# Patient Record
Sex: Female | Born: 1960 | Marital: Married
Health system: Southern US, Community
[De-identification: ages and names within clinical notes are randomized; demographics above are authoritative.]

---

## 2017-07-03 ENCOUNTER — Other Ambulatory Visit (HOSPITAL_COMMUNITY): Payer: Self-pay

## 2017-07-03 ENCOUNTER — Inpatient Hospital Stay
Admission: AD | Admit: 2017-07-03 | Discharge: 2017-07-23 | Disposition: A | Discharge status: Skilled Nursing Facility | Payer: Self-pay | Source: Ambulatory Visit | Attending: Internal Medicine | Admitting: Internal Medicine

## 2017-07-03 DIAGNOSIS — R52 Pain, unspecified: Secondary | ICD-10-CM

## 2017-07-03 DIAGNOSIS — Z0189 Encounter for other specified special examinations: Secondary | ICD-10-CM

## 2017-07-03 DIAGNOSIS — J9690 Respiratory failure, unspecified, unspecified whether with hypoxia or hypercapnia: Secondary | ICD-10-CM

## 2017-07-04 ENCOUNTER — Other Ambulatory Visit (HOSPITAL_COMMUNITY): Payer: Self-pay

## 2017-07-04 LAB — CBC WITH DIFFERENTIAL/PLATELET
Basophils Absolute: 0 10*3/uL (ref 0.0–0.1)
Basophils Relative: 1 %
EOS ABS: 0.1 10*3/uL (ref 0.0–0.7)
EOS PCT: 1 %
HCT: 27.4 % — ABNORMAL LOW (ref 36.0–46.0)
Hemoglobin: 8.3 g/dL — ABNORMAL LOW (ref 12.0–15.0)
LYMPHS ABS: 1.9 10*3/uL (ref 0.7–4.0)
Lymphocytes Relative: 25 %
MCH: 28 pg (ref 26.0–34.0)
MCHC: 30.3 g/dL (ref 30.0–36.0)
MCV: 92.6 fL (ref 78.0–100.0)
MONOS PCT: 13 %
Monocytes Absolute: 1 10*3/uL (ref 0.1–1.0)
Neutro Abs: 4.8 10*3/uL (ref 1.7–7.7)
Neutrophils Relative %: 60 %
PLATELETS: 660 10*3/uL — AB (ref 150–400)
RBC: 2.96 MIL/uL — ABNORMAL LOW (ref 3.87–5.11)
RDW: 15.8 % — AB (ref 11.5–15.5)
WBC: 7.9 10*3/uL (ref 4.0–10.5)

## 2017-07-04 LAB — COMPREHENSIVE METABOLIC PANEL
ALK PHOS: 94 U/L (ref 38–126)
ALT: 33 U/L (ref 14–54)
AST: 35 U/L (ref 15–41)
Albumin: 2.3 g/dL — ABNORMAL LOW (ref 3.5–5.0)
Anion gap: 16 — ABNORMAL HIGH (ref 5–15)
BUN: 32 mg/dL — AB (ref 6–20)
CHLORIDE: 102 mmol/L (ref 101–111)
CO2: 22 mmol/L (ref 22–32)
CREATININE: 1.01 mg/dL — AB (ref 0.44–1.00)
Calcium: 9.1 mg/dL (ref 8.9–10.3)
GFR calc Af Amer: >60 mL/min (ref >60)
Glucose, Bld: 184 mg/dL — ABNORMAL HIGH (ref 65–99)
Potassium: 4 mmol/L (ref 3.5–5.1)
SODIUM: 140 mmol/L (ref 135–145)
Total Bilirubin: 0.4 mg/dL (ref 0.3–1.2)
Total Protein: 7.1 g/dL (ref 6.5–8.1)

## 2017-07-04 LAB — PROTIME-INR
INR: 1.2
PROTHROMBIN TIME: 15.1 s (ref 11.4–15.2)

## 2017-07-04 LAB — HEMOGLOBIN A1C
Hgb A1c MFr Bld: 7.4 % — ABNORMAL HIGH (ref 4.8–5.6)
Mean Plasma Glucose: 165.68 mg/dL

## 2017-07-05 LAB — URINALYSIS, ROUTINE W REFLEX MICROSCOPIC
BILIRUBIN URINE: NEGATIVE
Glucose, UA: NEGATIVE mg/dL
Hgb urine dipstick: NEGATIVE
Ketones, ur: NEGATIVE mg/dL
Nitrite: NEGATIVE
PROTEIN: 100 mg/dL — AB
SPECIFIC GRAVITY, URINE: 1.016 (ref 1.005–1.030)
pH: 6 (ref 5.0–8.0)

## 2017-07-05 LAB — C DIFFICILE QUICK SCREEN W PCR REFLEX
C DIFFICILE (CDIFF) INTERP: NOT DETECTED
C DIFFICILE (CDIFF) TOXIN: NEGATIVE
C Diff antigen: NEGATIVE

## 2017-07-06 LAB — URINE CULTURE: CULTURE: NO GROWTH

## 2017-07-07 LAB — BASIC METABOLIC PANEL
ANION GAP: 14 (ref 5–15)
BUN: 25 mg/dL — ABNORMAL HIGH (ref 6–20)
CALCIUM: 9.6 mg/dL (ref 8.9–10.3)
CO2: 25 mmol/L (ref 22–32)
Chloride: 99 mmol/L — ABNORMAL LOW (ref 101–111)
Creatinine, Ser: 0.81 mg/dL (ref 0.44–1.00)
GLUCOSE: 198 mg/dL — AB (ref 65–99)
POTASSIUM: 4 mmol/L (ref 3.5–5.1)
Sodium: 138 mmol/L (ref 135–145)

## 2017-07-07 LAB — CBC
HEMATOCRIT: 31.2 % — AB (ref 36.0–46.0)
HEMOGLOBIN: 9.5 g/dL — AB (ref 12.0–15.0)
MCH: 27.6 pg (ref 26.0–34.0)
MCHC: 30.4 g/dL (ref 30.0–36.0)
MCV: 90.7 fL (ref 78.0–100.0)
Platelets: 701 10*3/uL — ABNORMAL HIGH (ref 150–400)
RBC: 3.44 MIL/uL — AB (ref 3.87–5.11)
RDW: 15.3 % (ref 11.5–15.5)
WBC: 8.6 10*3/uL (ref 4.0–10.5)

## 2017-07-09 LAB — PHOSPHORUS: Phosphorus: 4.5 mg/dL (ref 2.5–4.6)

## 2017-07-09 LAB — CBC
HEMATOCRIT: 30.5 % — AB (ref 36.0–46.0)
Hemoglobin: 9.3 g/dL — ABNORMAL LOW (ref 12.0–15.0)
MCH: 27.6 pg (ref 26.0–34.0)
MCHC: 30.5 g/dL (ref 30.0–36.0)
MCV: 90.5 fL (ref 78.0–100.0)
Platelets: 589 10*3/uL — ABNORMAL HIGH (ref 150–400)
RBC: 3.37 MIL/uL — ABNORMAL LOW (ref 3.87–5.11)
RDW: 15.6 % — AB (ref 11.5–15.5)
WBC: 7.8 10*3/uL (ref 4.0–10.5)

## 2017-07-09 LAB — BASIC METABOLIC PANEL
ANION GAP: 14 (ref 5–15)
BUN: 30 mg/dL — ABNORMAL HIGH (ref 6–20)
CALCIUM: 9.7 mg/dL (ref 8.9–10.3)
CO2: 26 mmol/L (ref 22–32)
Chloride: 98 mmol/L — ABNORMAL LOW (ref 101–111)
Creatinine, Ser: 0.93 mg/dL (ref 0.44–1.00)
Glucose, Bld: 199 mg/dL — ABNORMAL HIGH (ref 65–99)
Potassium: 3.9 mmol/L (ref 3.5–5.1)
Sodium: 138 mmol/L (ref 135–145)

## 2017-07-09 LAB — MAGNESIUM: MAGNESIUM: 1.7 mg/dL (ref 1.7–2.4)

## 2017-07-09 LAB — TSH: TSH: 2.117 u[IU]/mL (ref 0.350–4.500)

## 2017-07-10 ENCOUNTER — Other Ambulatory Visit (HOSPITAL_COMMUNITY): Payer: Self-pay

## 2017-07-11 LAB — CBC
HCT: 30.7 % — ABNORMAL LOW (ref 36.0–46.0)
Hemoglobin: 9.7 g/dL — ABNORMAL LOW (ref 12.0–15.0)
MCH: 28.3 pg (ref 26.0–34.0)
MCHC: 31.6 g/dL (ref 30.0–36.0)
MCV: 89.5 fL (ref 78.0–100.0)
PLATELETS: 450 10*3/uL — AB (ref 150–400)
RBC: 3.43 MIL/uL — ABNORMAL LOW (ref 3.87–5.11)
RDW: 15.5 % (ref 11.5–15.5)
WBC: 7.4 10*3/uL (ref 4.0–10.5)

## 2017-07-11 LAB — BASIC METABOLIC PANEL
Anion gap: 14 (ref 5–15)
BUN: 21 mg/dL — AB (ref 6–20)
CALCIUM: 9.7 mg/dL (ref 8.9–10.3)
CO2: 23 mmol/L (ref 22–32)
CREATININE: 0.96 mg/dL (ref 0.44–1.00)
Chloride: 100 mmol/L — ABNORMAL LOW (ref 101–111)
GFR calc Af Amer: >60 mL/min (ref >60)
GLUCOSE: 135 mg/dL — AB (ref 65–99)
Potassium: 4.2 mmol/L (ref 3.5–5.1)
Sodium: 137 mmol/L (ref 135–145)

## 2017-07-11 LAB — CULTURE, RESPIRATORY W GRAM STAIN

## 2017-07-11 LAB — MAGNESIUM: Magnesium: 1.5 mg/dL — ABNORMAL LOW (ref 1.7–2.4)

## 2017-07-12 LAB — BASIC METABOLIC PANEL
Anion gap: 14 (ref 5–15)
BUN: 20 mg/dL (ref 6–20)
CALCIUM: 9.8 mg/dL (ref 8.9–10.3)
CO2: 23 mmol/L (ref 22–32)
Chloride: 99 mmol/L — ABNORMAL LOW (ref 101–111)
Creatinine, Ser: 1.02 mg/dL — ABNORMAL HIGH (ref 0.44–1.00)
GFR calc Af Amer: >60 mL/min (ref >60)
GFR, EST NON AFRICAN AMERICAN: 60 mL/min — AB (ref >60)
Glucose, Bld: 152 mg/dL — ABNORMAL HIGH (ref 65–99)
POTASSIUM: 4.3 mmol/L (ref 3.5–5.1)
Sodium: 136 mmol/L (ref 135–145)

## 2017-07-12 LAB — MAGNESIUM: Magnesium: 1.5 mg/dL — ABNORMAL LOW (ref 1.7–2.4)

## 2017-07-14 LAB — CBC
HEMATOCRIT: 34.8 % — AB (ref 36.0–46.0)
HEMOGLOBIN: 10.8 g/dL — AB (ref 12.0–15.0)
MCH: 27.8 pg (ref 26.0–34.0)
MCHC: 31 g/dL (ref 30.0–36.0)
MCV: 89.5 fL (ref 78.0–100.0)
Platelets: 504 10*3/uL — ABNORMAL HIGH (ref 150–400)
RBC: 3.89 MIL/uL (ref 3.87–5.11)
RDW: 15.3 % (ref 11.5–15.5)
WBC: 7.8 10*3/uL (ref 4.0–10.5)

## 2017-07-14 LAB — BASIC METABOLIC PANEL
ANION GAP: 14 (ref 5–15)
BUN: 24 mg/dL — ABNORMAL HIGH (ref 6–20)
CHLORIDE: 99 mmol/L — AB (ref 101–111)
CO2: 22 mmol/L (ref 22–32)
Calcium: 9.9 mg/dL (ref 8.9–10.3)
Creatinine, Ser: 1.01 mg/dL — ABNORMAL HIGH (ref 0.44–1.00)
GFR calc non Af Amer: >60 mL/min (ref >60)
Glucose, Bld: 163 mg/dL — ABNORMAL HIGH (ref 65–99)
POTASSIUM: 4 mmol/L (ref 3.5–5.1)
Sodium: 135 mmol/L (ref 135–145)

## 2017-07-14 LAB — MAGNESIUM: Magnesium: 1.7 mg/dL (ref 1.7–2.4)

## 2017-07-15 LAB — MAGNESIUM: Magnesium: 1.9 mg/dL (ref 1.7–2.4)

## 2017-07-17 LAB — CBC
HEMATOCRIT: 35.8 % — AB (ref 36.0–46.0)
HEMOGLOBIN: 11.2 g/dL — AB (ref 12.0–15.0)
MCH: 27.9 pg (ref 26.0–34.0)
MCHC: 31.3 g/dL (ref 30.0–36.0)
MCV: 89.3 fL (ref 78.0–100.0)
Platelets: 506 10*3/uL — ABNORMAL HIGH (ref 150–400)
RBC: 4.01 MIL/uL (ref 3.87–5.11)
RDW: 15.7 % — AB (ref 11.5–15.5)
WBC: 6.7 10*3/uL (ref 4.0–10.5)

## 2017-07-17 LAB — BASIC METABOLIC PANEL
ANION GAP: 12 (ref 5–15)
BUN: 35 mg/dL — AB (ref 6–20)
CHLORIDE: 102 mmol/L (ref 101–111)
CO2: 22 mmol/L (ref 22–32)
Calcium: 10.5 mg/dL — ABNORMAL HIGH (ref 8.9–10.3)
Creatinine, Ser: 1.29 mg/dL — ABNORMAL HIGH (ref 0.44–1.00)
GFR calc non Af Amer: 45 mL/min — ABNORMAL LOW (ref >60)
GFR, EST AFRICAN AMERICAN: 52 mL/min — AB (ref >60)
Glucose, Bld: 166 mg/dL — ABNORMAL HIGH (ref 65–99)
POTASSIUM: 4.6 mmol/L (ref 3.5–5.1)
SODIUM: 136 mmol/L (ref 135–145)

## 2017-07-19 LAB — BASIC METABOLIC PANEL
ANION GAP: 11 (ref 5–15)
BUN: 14 mg/dL (ref 6–20)
CHLORIDE: 108 mmol/L (ref 101–111)
CO2: 22 mmol/L (ref 22–32)
Calcium: 9.7 mg/dL (ref 8.9–10.3)
Creatinine, Ser: 0.93 mg/dL (ref 0.44–1.00)
GFR calc Af Amer: >60 mL/min (ref >60)
GFR calc non Af Amer: >60 mL/min (ref >60)
Glucose, Bld: 119 mg/dL — ABNORMAL HIGH (ref 65–99)
POTASSIUM: 3.7 mmol/L (ref 3.5–5.1)
Sodium: 141 mmol/L (ref 135–145)

## 2017-07-19 LAB — MAGNESIUM: Magnesium: 1.5 mg/dL — ABNORMAL LOW (ref 1.7–2.4)

## 2017-07-20 LAB — MAGNESIUM: Magnesium: 1.7 mg/dL (ref 1.7–2.4)

## 2017-07-21 ENCOUNTER — Other Ambulatory Visit (HOSPITAL_COMMUNITY): Payer: Self-pay

## 2017-07-21 LAB — BASIC METABOLIC PANEL
ANION GAP: 13 (ref 5–15)
BUN: 17 mg/dL (ref 6–20)
CHLORIDE: 103 mmol/L (ref 101–111)
CO2: 20 mmol/L — AB (ref 22–32)
Calcium: 9.7 mg/dL (ref 8.9–10.3)
Creatinine, Ser: 1.2 mg/dL — ABNORMAL HIGH (ref 0.44–1.00)
GFR calc Af Amer: 57 mL/min — ABNORMAL LOW (ref >60)
GFR, EST NON AFRICAN AMERICAN: 49 mL/min — AB (ref >60)
GLUCOSE: 243 mg/dL — AB (ref 65–99)
POTASSIUM: 3.8 mmol/L (ref 3.5–5.1)
Sodium: 136 mmol/L (ref 135–145)

## 2017-07-21 LAB — CBC
HCT: 33.3 % — ABNORMAL LOW (ref 36.0–46.0)
HEMOGLOBIN: 10.2 g/dL — AB (ref 12.0–15.0)
MCH: 27.6 pg (ref 26.0–34.0)
MCHC: 30.6 g/dL (ref 30.0–36.0)
MCV: 90.2 fL (ref 78.0–100.0)
Platelets: 395 10*3/uL (ref 150–400)
RBC: 3.69 MIL/uL — AB (ref 3.87–5.11)
RDW: 15.5 % (ref 11.5–15.5)
WBC: 6.1 10*3/uL (ref 4.0–10.5)

## 2017-07-21 LAB — MAGNESIUM
Magnesium: 1.5 mg/dL — ABNORMAL LOW (ref 1.7–2.4)
Magnesium: 1.5 mg/dL — ABNORMAL LOW (ref 1.7–2.4)

## 2017-07-22 ENCOUNTER — Encounter (HOSPITAL_BASED_OUTPATIENT_CLINIC_OR_DEPARTMENT_OTHER): Payer: Self-pay

## 2017-07-22 DIAGNOSIS — M79609 Pain in unspecified limb: Secondary | ICD-10-CM

## 2017-07-22 LAB — BASIC METABOLIC PANEL
ANION GAP: 11 (ref 5–15)
BUN: 18 mg/dL (ref 6–20)
CALCIUM: 9.9 mg/dL (ref 8.9–10.3)
CHLORIDE: 107 mmol/L (ref 101–111)
CO2: 21 mmol/L — ABNORMAL LOW (ref 22–32)
Creatinine, Ser: 1.02 mg/dL — ABNORMAL HIGH (ref 0.44–1.00)
GFR calc non Af Amer: 60 mL/min — ABNORMAL LOW (ref >60)
GLUCOSE: 144 mg/dL — AB (ref 65–99)
Potassium: 4.4 mmol/L (ref 3.5–5.1)
Sodium: 139 mmol/L (ref 135–145)

## 2017-07-22 LAB — PHOSPHORUS: PHOSPHORUS: 5.3 mg/dL — AB (ref 2.5–4.6)

## 2017-07-22 LAB — MAGNESIUM: Magnesium: 1.9 mg/dL (ref 1.7–2.4)

## 2017-07-22 NOTE — Progress Notes (Signed)
Right lower extremity venous duplex has been completed. Negative for DVT.  07/22/17 12:12 PM Sandra CordialGreg [name] RVT

## 2018-11-01 IMAGING — DX DG CHEST 1V PORT
1 series · 1 of 1 positions shown · non-contrast
Comparison: None.

CLINICAL DATA: Respiratory failure

EXAM:
PORTABLE CHEST 1 VIEW

[chest]
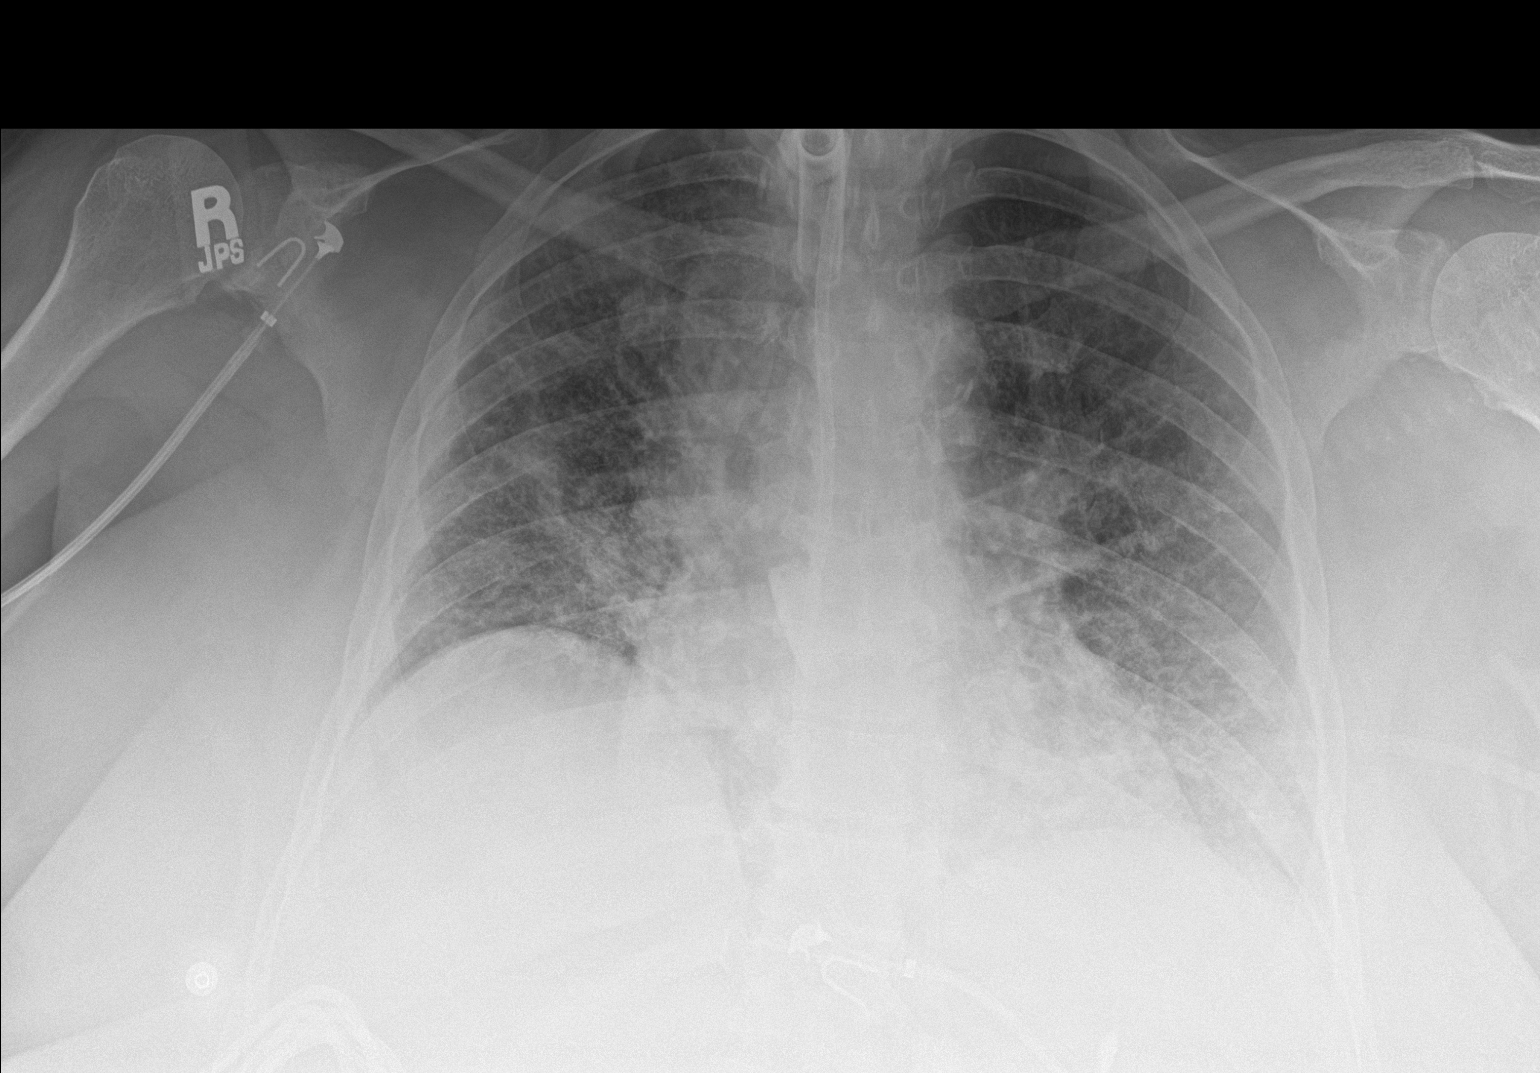

[1 of 1 positions shown; findings below may reference images not displayed]

FINDINGS: Tracheostomy catheter tip is 3.5 cm above the carina. Feeding tube
tip and side port are below the diaphragm. No pneumothorax. There is
apparent fibrosis in the lung bases. There is atelectatic change in
the right lower lobe.

Heart is mildly enlarged with pulmonary vascularity normal. There is
aortic atherosclerosis. No adenopathy. No bone lesions.
IMPRESSION: Tube positions as described without pneumothorax. Lower lung zone
fibrosis with right base atelectasis. No frank consolidation. Mild
cardiomegaly. There is aortic atherosclerosis.

Aortic Atherosclerosis ([id number]).

## 2018-11-18 IMAGING — DX DG KNEE 1-2V PORT*R*
2 series · 2 of 2 positions shown · non-contrast
Comparison: None.

CLINICAL DATA: Acute onset anterior right knee pain without injury.

EXAM:
PORTABLE RIGHT KNEE - 1-2 VIEW

[knee ap]
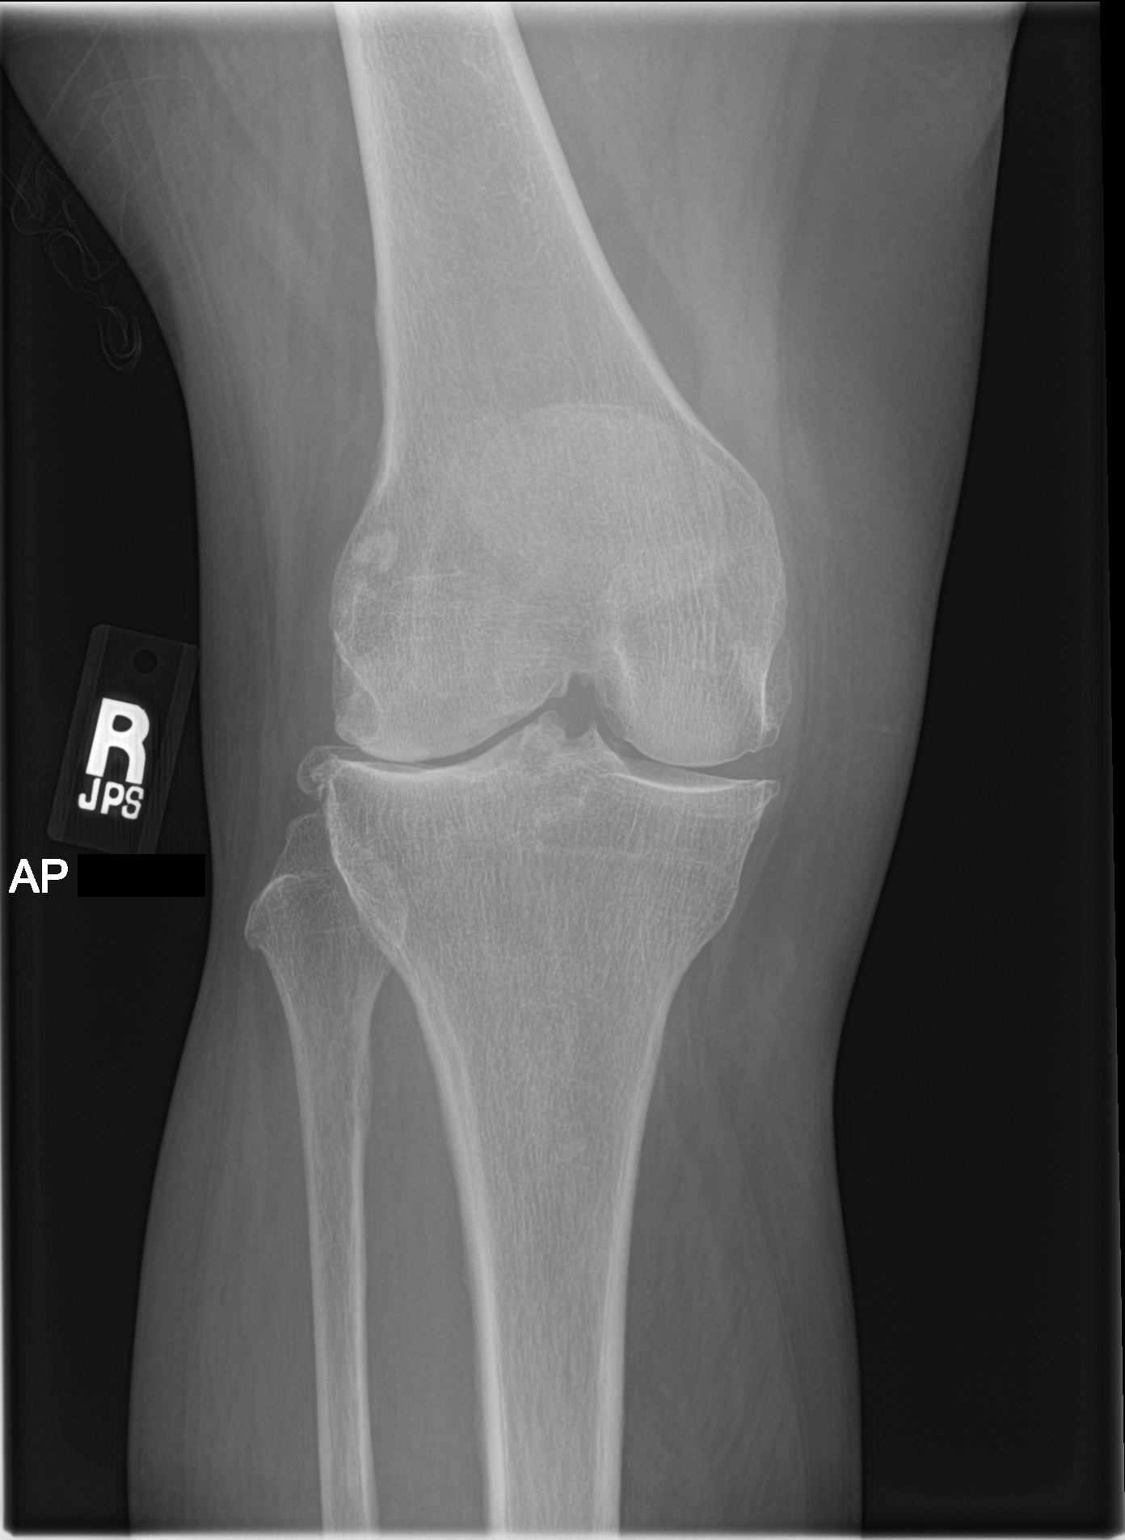

[knee lat]
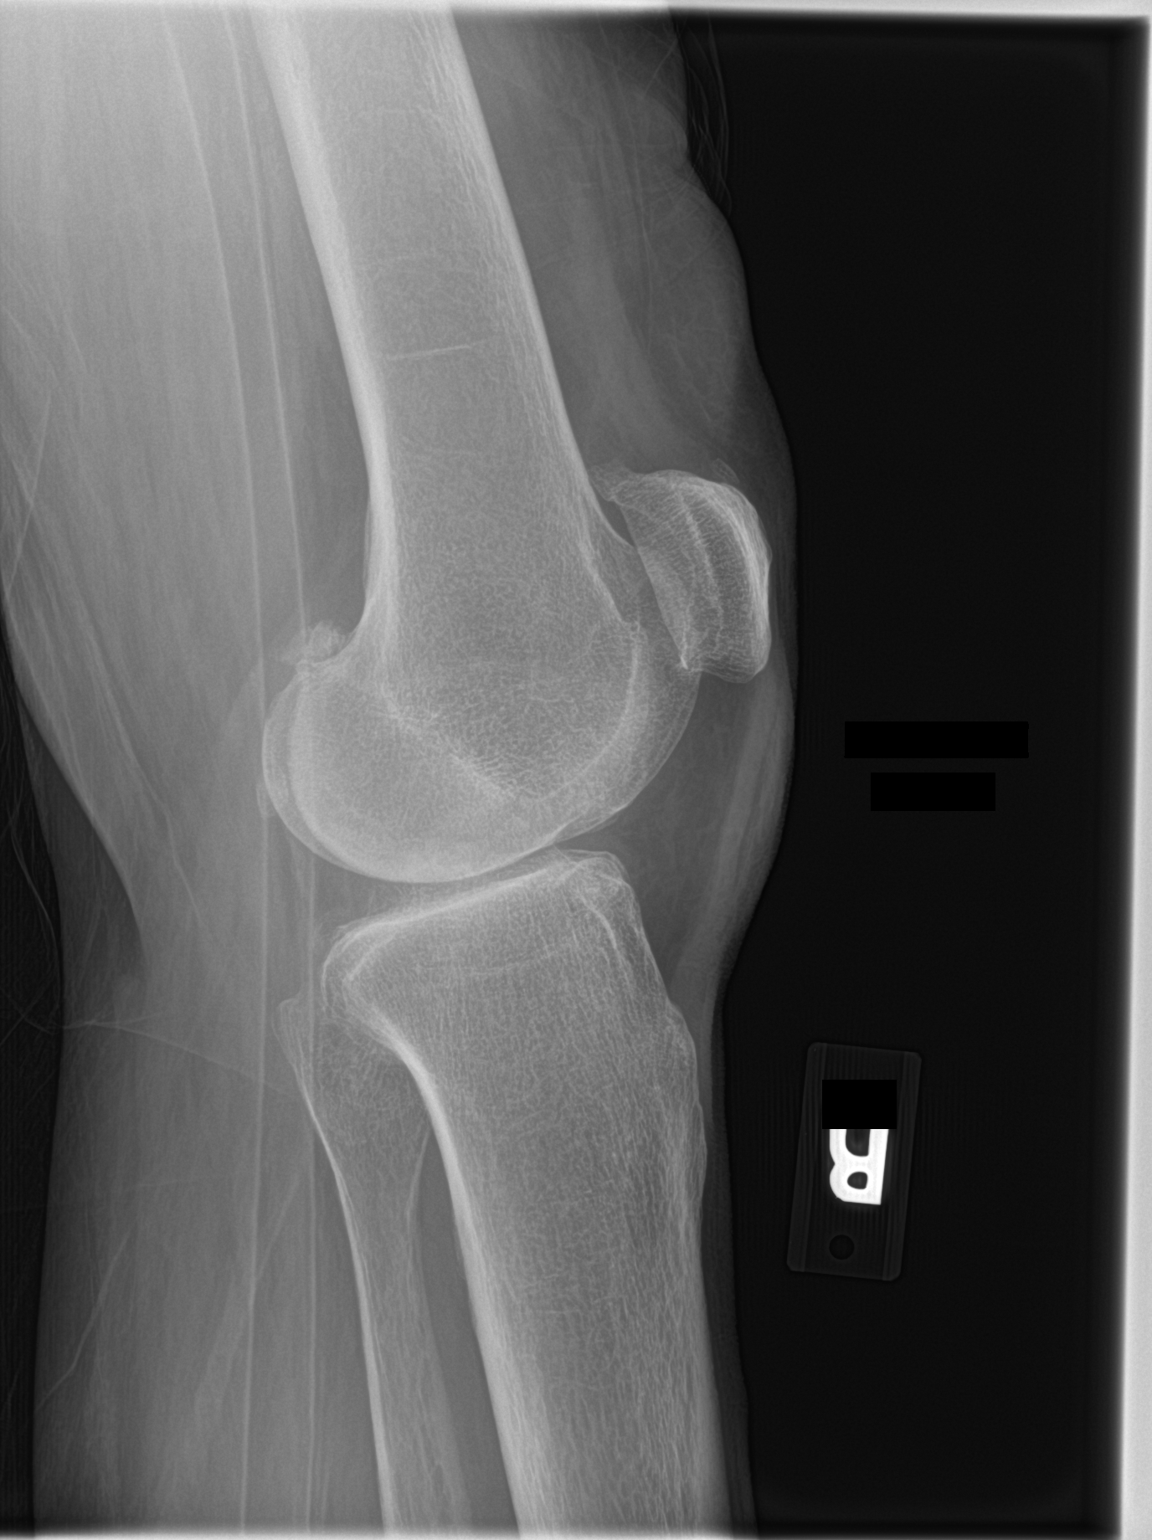

[2 of 2 positions shown; findings below may reference images not displayed]

FINDINGS: No acute bony or joint abnormality is identified. The patient has
tricompartmental degenerative change with bulky osteophytosis
identified about the knee. Joint space narrowing is worst in the
lateral compartment. No joint effusion.
IMPRESSION: No acute abnormality.

Advanced for age appearing osteoarthritis is worst in the lateral
compartment.
